# Patient Record
Sex: Male | Born: 1985
Health system: Southern US, Community
[De-identification: ages and names within clinical notes are randomized; demographics above are authoritative.]

## PROBLEM LIST (undated history)

## (undated) DIAGNOSIS — T7840XA Allergy, unspecified, initial encounter: Secondary | ICD-10-CM

## (undated) HISTORY — DX: Allergy, unspecified, initial encounter: T78.40XA

---

## 2015-10-22 ENCOUNTER — Ambulatory Visit (INDEPENDENT_AMBULATORY_CARE_PROVIDER_SITE_OTHER): Payer: 59 | Admitting: Internal Medicine

## 2015-10-22 VITALS — BP 110/64 | HR 83 | Temp 98.2°F | Resp 16 | Ht 67.0 in | Wt 176.0 lb

## 2015-10-22 DIAGNOSIS — J0100 Acute maxillary sinusitis, unspecified: Secondary | ICD-10-CM | POA: Diagnosis not present

## 2015-10-22 DIAGNOSIS — J301 Allergic rhinitis due to pollen: Secondary | ICD-10-CM | POA: Diagnosis not present

## 2015-10-22 MED ORDER — AMOXICILLIN 875 MG PO TABS
875.0000 mg | ORAL_TABLET | Freq: Two times a day (BID) | ORAL | Status: AC
Start: 2015-10-22 — End: ?

## 2015-10-22 MED ORDER — PREDNISONE 20 MG PO TABS: ORAL_TABLET | ORAL | Status: AC

## 2015-10-22 NOTE — Progress Notes (Signed)
   By signing my name below I, Tereasa Coop, attest that this documentation has been prepared under the direction and in the presence of Tami Lin, MD. Electonically Signed. Tereasa Coop, Scribe 10/22/2015 at 4:21 PM  Subjective:    Patient ID: Lawrence Santiago, male    DOB: 11/12/1985, 30 y.o.   MRN: [id number]  Chief Complaint  Patient presents with  . Sinusitis    allergies/ x 75months    HPI Lawrence Santiago is a 30 y.o. male who presents to the [facility] complaining of sinus congestion for the past 3 months. Symptoms started within a few weeks of stopping his allergy medication. Pt reports difficulty breathing through his nose at night. Pt also states that he wakes up with sinus pressure and congestion. Pt denies any wheezing. Pt had a cough initially with symptoms and states cough resolved over time.   Nasal discharge is yellow in the morning.   There are no active problems to display for this patient.  No current outpatient prescriptions on file.  Not on File    Review of Systems  Constitutional: Negative for fever.  HENT: Positive for congestion.   Respiratory: Negative for cough and wheezing.        Objective:   Physical Exam  Constitutional: He is oriented to person, place, and time. He appears well-developed and well-nourished. No distress.  HENT:  Head: Normocephalic and atraumatic.  Right Ear: Tympanic membrane and ear canal normal.  Left Ear: Tympanic membrane and ear canal normal.  Mouth/Throat: Oropharynx is clear and moist. No posterior oropharyngeal erythema.  Nares boggy with purulent discharge.   Eyes: Conjunctivae are normal. Pupils are equal, round, and reactive to light.  Neck: Neck supple.  Cardiovascular: Normal rate, regular rhythm and normal heart sounds.  Exam reveals no gallop.   No murmur heard. Pulmonary/Chest: Effort normal and breath sounds normal. He has no decreased breath sounds. He has no wheezes. He has no  rhonchi. He has no rales.  Musculoskeletal: Normal range of motion.  Lymphadenopathy:    He has no cervical adenopathy.  Neurological: He is alert and oriented to person, place, and time.  Skin: Skin is warm and dry.  Psychiatric: He has a normal mood and affect. His behavior is normal.  Nursing note and vitals reviewed.    Filed Vitals:   10/22/15 1533  BP: 110/64  Pulse: 83  Temp: 98.2 F (36.8 C)  TempSrc: Oral  Resp: 16  Height: 5\' 7"  (1.702 m)  Weight: 176 lb (79.833 kg)  SpO2: 98%        Assessment & Plan:   Acute maxillary sinusitis, recurrence not specified  Allergic rhinitis due to pollen  Meds ordered this encounter  Medications  . amoxicillin (AMOXIL) 875 MG tablet    Sig: Take 1 tablet (875 mg total) by mouth 2 (two) times daily.    Dispense:  20 tablet    Refill:  0  . predniSONE (DELTASONE) 20 MG tablet    Sig: 3/3/2/2/1/1 single daily dose for 6 days    Dispense:  12 tablet    Refill:  0  restart zyrtec and flonase til late June  I have completed the patient encounter in its entirety as documented by the scribe, with editing by me where necessary. Graves Nipp P. Laney Pastor, M.D.

## 2015-10-22 NOTE — Patient Instructions (Signed)
     IF you received an x-ray today, you will receive an invoice from [facility]. Please contact [facility] at [phone] with questions or concerns regarding your invoice.   IF you received labwork today, you will receive an invoice from [facility]. Please contact [facility] at [phone] with questions or concerns regarding your invoice.   Our billing staff will not be able to assist you with questions regarding bills from these companies.  You will be contacted with the lab results as soon as they are available. The fastest way to get your results is to activate your My Chart account. Instructions are located on the last page of this paperwork. If you have not heard from us regarding the results in 2 weeks, please contact this office.      

## 2016-12-04 DIAGNOSIS — Z131 Encounter for screening for diabetes mellitus: Secondary | ICD-10-CM | POA: Diagnosis not present

## 2016-12-04 DIAGNOSIS — Z23 Encounter for immunization: Secondary | ICD-10-CM | POA: Diagnosis not present

## 2016-12-04 DIAGNOSIS — Z0000 Encounter for general adult medical examination without abnormal findings: Secondary | ICD-10-CM | POA: Diagnosis not present

## 2017-09-28 DIAGNOSIS — Z0100 Encounter for examination of eyes and vision without abnormal findings: Secondary | ICD-10-CM | POA: Diagnosis not present

## 2017-12-09 DIAGNOSIS — D179 Benign lipomatous neoplasm, unspecified: Secondary | ICD-10-CM | POA: Diagnosis not present

## 2017-12-09 DIAGNOSIS — Z0000 Encounter for general adult medical examination without abnormal findings: Secondary | ICD-10-CM | POA: Diagnosis not present

## 2017-12-09 DIAGNOSIS — Z136 Encounter for screening for cardiovascular disorders: Secondary | ICD-10-CM | POA: Diagnosis not present

## 2017-12-09 DIAGNOSIS — H6120 Impacted cerumen, unspecified ear: Secondary | ICD-10-CM | POA: Diagnosis not present

## 2018-01-29 DIAGNOSIS — D170 Benign lipomatous neoplasm of skin and subcutaneous tissue of head, face and neck: Secondary | ICD-10-CM | POA: Diagnosis not present

## 2018-02-06 DIAGNOSIS — D170 Benign lipomatous neoplasm of skin and subcutaneous tissue of head, face and neck: Secondary | ICD-10-CM | POA: Diagnosis not present

## 2018-08-02 DIAGNOSIS — H5213 Myopia, bilateral: Secondary | ICD-10-CM | POA: Diagnosis not present

## 2018-11-12 ENCOUNTER — Encounter (HOSPITAL_COMMUNITY): Payer: Self-pay | Admitting: Emergency Medicine

## 2018-11-12 ENCOUNTER — Emergency Department (HOSPITAL_COMMUNITY): Payer: 59

## 2018-11-12 ENCOUNTER — Other Ambulatory Visit: Payer: Self-pay

## 2018-11-12 ENCOUNTER — Emergency Department (HOSPITAL_COMMUNITY)
Admission: EM | Admit: 2018-11-12 | Discharge: 2018-11-12 | Disposition: A | Discharge status: Home / Self Care | Payer: 59 | Attending: Emergency Medicine | Admitting: Emergency Medicine

## 2018-11-12 DIAGNOSIS — Y939 Activity, unspecified: Secondary | ICD-10-CM | POA: Insufficient documentation

## 2018-11-12 DIAGNOSIS — M79602 Pain in left arm: Secondary | ICD-10-CM | POA: Insufficient documentation

## 2018-11-12 DIAGNOSIS — Y999 Unspecified external cause status: Secondary | ICD-10-CM | POA: Diagnosis not present

## 2018-11-12 DIAGNOSIS — Z79899 Other long term (current) drug therapy: Secondary | ICD-10-CM | POA: Insufficient documentation

## 2018-11-12 DIAGNOSIS — Y929 Unspecified place or not applicable: Secondary | ICD-10-CM | POA: Insufficient documentation
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
## 2018-11-12 NOTE — ED Notes (Addendum)
Patient ambulatory to restroom and back without any problems and no assistant needed.

## 2018-11-12 NOTE — ED Notes (Signed)
Using clean technique, cleansed abrasion to the left forearm with wound cleaner. Applied xeroform over the wound. Covered with gauze and secured with kelix and tape.
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
## 2019-04-14 ENCOUNTER — Other Ambulatory Visit: Payer: Self-pay

## 2019-04-14 DIAGNOSIS — Z20822 Contact with and (suspected) exposure to covid-19: Secondary | ICD-10-CM

## 2019-04-15 LAB — NOVEL CORONAVIRUS, NAA: SARS-CoV-2, NAA: DETECTED — AB
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
